# Patient Record
Sex: Male | Born: 1940 | Race: White | Hispanic: No | Marital: Single | Smoking: Never smoker
Health system: Southern US, Community
[De-identification: ages and names within clinical notes are randomized; demographics above are authoritative.]

## PROBLEM LIST (undated history)

## (undated) DIAGNOSIS — I10 Essential (primary) hypertension: Secondary | ICD-10-CM

## (undated) HISTORY — DX: Essential (primary) hypertension: I10

---

## 2000-06-18 ENCOUNTER — Encounter: Payer: Self-pay | Admitting: Urology

## 2000-06-18 ENCOUNTER — Ambulatory Visit (HOSPITAL_COMMUNITY): Admission: RE | Admit: 2000-06-18 | Discharge: 2000-06-18 | Payer: Self-pay | Admitting: Urology

## 2000-06-25 ENCOUNTER — Ambulatory Visit (HOSPITAL_COMMUNITY): Admission: RE | Admit: 2000-06-25 | Discharge: 2000-06-25 | Payer: Self-pay | Admitting: Urology

## 2002-03-09 ENCOUNTER — Emergency Department (HOSPITAL_COMMUNITY): Admission: EM | Admit: 2002-03-09 | Discharge: 2002-03-09 | Payer: Self-pay | Admitting: Emergency Medicine

## 2002-03-09 ENCOUNTER — Encounter: Payer: Self-pay | Admitting: Emergency Medicine

## 2002-03-30 ENCOUNTER — Inpatient Hospital Stay (HOSPITAL_COMMUNITY): Admission: EM | Admit: 2002-03-30 | Discharge: 2002-04-09 | Payer: Self-pay | Admitting: Emergency Medicine

## 2002-03-30 ENCOUNTER — Encounter: Payer: Self-pay | Admitting: Specialist

## 2002-03-31 ENCOUNTER — Encounter: Payer: Self-pay | Admitting: Neurology

## 2002-04-03 ENCOUNTER — Encounter: Payer: Self-pay | Admitting: Specialist

## 2002-04-04 ENCOUNTER — Encounter: Payer: Self-pay | Admitting: Neurosurgery

## 2002-06-23 ENCOUNTER — Ambulatory Visit (HOSPITAL_COMMUNITY): Admission: RE | Admit: 2002-06-23 | Discharge: 2002-06-23 | Payer: Self-pay | Admitting: Neurology

## 2002-06-29 ENCOUNTER — Encounter: Payer: Self-pay | Admitting: Neurology

## 2002-06-29 ENCOUNTER — Inpatient Hospital Stay (HOSPITAL_COMMUNITY): Admission: RE | Admit: 2002-06-29 | Discharge: 2002-06-30 | Payer: Self-pay | Admitting: Interventional Radiology

## 2002-07-29 ENCOUNTER — Encounter: Payer: Self-pay | Admitting: Neurology

## 2002-07-29 ENCOUNTER — Ambulatory Visit (HOSPITAL_COMMUNITY): Admission: RE | Admit: 2002-07-29 | Discharge: 2002-07-29 | Payer: Self-pay | Admitting: Neurology

## 2002-11-06 ENCOUNTER — Ambulatory Visit (HOSPITAL_COMMUNITY): Admission: RE | Admit: 2002-11-06 | Discharge: 2002-11-06 | Payer: Self-pay | Admitting: Neurology

## 2002-11-06 ENCOUNTER — Encounter: Payer: Self-pay | Admitting: Neurology

## 2003-02-17 ENCOUNTER — Encounter
Admission: RE | Admit: 2003-02-17 | Discharge: 2003-05-18 | Payer: Self-pay | Admitting: Physical Medicine & Rehabilitation

## 2003-04-18 ENCOUNTER — Encounter
Admission: RE | Admit: 2003-04-18 | Discharge: 2003-06-12 | Payer: Self-pay | Admitting: Physical Medicine & Rehabilitation

## 2003-05-19 ENCOUNTER — Encounter
Admission: RE | Admit: 2003-05-19 | Discharge: 2003-08-17 | Payer: Self-pay | Admitting: Physical Medicine & Rehabilitation

## 2003-09-20 ENCOUNTER — Encounter
Admission: RE | Admit: 2003-09-20 | Discharge: 2003-12-19 | Payer: Self-pay | Admitting: Physical Medicine & Rehabilitation

## 2003-12-21 ENCOUNTER — Encounter
Admission: RE | Admit: 2003-12-21 | Discharge: 2004-03-20 | Payer: Self-pay | Admitting: Physical Medicine & Rehabilitation

## 2003-12-28 ENCOUNTER — Ambulatory Visit (HOSPITAL_COMMUNITY)
Admission: RE | Admit: 2003-12-28 | Discharge: 2003-12-28 | Payer: Self-pay | Admitting: Physical Medicine & Rehabilitation

## 2004-05-02 ENCOUNTER — Encounter
Admission: RE | Admit: 2004-05-02 | Discharge: 2004-07-31 | Payer: Self-pay | Admitting: Physical Medicine & Rehabilitation

## 2004-05-03 ENCOUNTER — Ambulatory Visit: Payer: Self-pay | Admitting: Physical Medicine & Rehabilitation

## 2004-05-04 ENCOUNTER — Emergency Department (HOSPITAL_COMMUNITY): Admission: EM | Admit: 2004-05-04 | Discharge: 2004-05-04 | Payer: Self-pay | Admitting: Emergency Medicine

## 2004-05-09 ENCOUNTER — Encounter
Admission: RE | Admit: 2004-05-09 | Discharge: 2004-05-09 | Payer: Self-pay | Admitting: Physical Medicine & Rehabilitation

## 2004-09-09 ENCOUNTER — Ambulatory Visit: Payer: Self-pay | Admitting: Physical Medicine & Rehabilitation

## 2004-09-09 ENCOUNTER — Encounter
Admission: RE | Admit: 2004-09-09 | Discharge: 2004-12-04 | Payer: Self-pay | Admitting: Physical Medicine & Rehabilitation

## 2004-12-04 ENCOUNTER — Encounter
Admission: RE | Admit: 2004-12-04 | Discharge: 2005-03-04 | Payer: Self-pay | Admitting: Physical Medicine & Rehabilitation

## 2004-12-06 ENCOUNTER — Ambulatory Visit: Payer: Self-pay | Admitting: Physical Medicine & Rehabilitation

## 2005-03-07 ENCOUNTER — Ambulatory Visit: Admission: RE | Admit: 2005-03-07 | Discharge: 2005-03-07 | Payer: Self-pay | Admitting: Urology

## 2005-04-10 ENCOUNTER — Encounter (INDEPENDENT_AMBULATORY_CARE_PROVIDER_SITE_OTHER): Payer: Self-pay | Admitting: Specialist

## 2005-04-10 ENCOUNTER — Inpatient Hospital Stay (HOSPITAL_COMMUNITY): Admission: RE | Admit: 2005-04-10 | Discharge: 2005-04-12 | Payer: Self-pay | Admitting: Urology

## 2005-05-20 ENCOUNTER — Encounter
Admission: RE | Admit: 2005-05-20 | Discharge: 2005-08-18 | Payer: Self-pay | Admitting: Physical Medicine & Rehabilitation

## 2005-05-20 ENCOUNTER — Ambulatory Visit: Payer: Self-pay | Admitting: Physical Medicine & Rehabilitation

## 2005-08-06 ENCOUNTER — Ambulatory Visit: Payer: Self-pay | Admitting: Physical Medicine & Rehabilitation

## 2006-01-23 ENCOUNTER — Ambulatory Visit: Payer: Self-pay | Admitting: Physical Medicine & Rehabilitation

## 2006-01-23 ENCOUNTER — Encounter
Admission: RE | Admit: 2006-01-23 | Discharge: 2006-04-23 | Payer: Self-pay | Admitting: Physical Medicine & Rehabilitation

## 2006-07-24 ENCOUNTER — Encounter
Admission: RE | Admit: 2006-07-24 | Discharge: 2006-10-22 | Payer: Self-pay | Admitting: Physical Medicine & Rehabilitation

## 2006-07-24 ENCOUNTER — Ambulatory Visit: Payer: Self-pay | Admitting: Physical Medicine & Rehabilitation

## 2007-02-09 ENCOUNTER — Encounter
Admission: RE | Admit: 2007-02-09 | Discharge: 2007-05-10 | Payer: Self-pay | Admitting: Physical Medicine & Rehabilitation

## 2007-02-09 ENCOUNTER — Ambulatory Visit: Payer: Self-pay | Admitting: Physical Medicine & Rehabilitation

## 2007-08-09 ENCOUNTER — Encounter
Admission: RE | Admit: 2007-08-09 | Discharge: 2007-10-21 | Payer: Self-pay | Admitting: Physical Medicine & Rehabilitation

## 2007-08-09 ENCOUNTER — Ambulatory Visit: Payer: Self-pay | Admitting: Physical Medicine & Rehabilitation

## 2008-01-31 ENCOUNTER — Encounter
Admission: RE | Admit: 2008-01-31 | Discharge: 2008-04-30 | Payer: Self-pay | Admitting: Physical Medicine & Rehabilitation

## 2008-02-02 ENCOUNTER — Ambulatory Visit: Payer: Self-pay | Admitting: Physical Medicine & Rehabilitation

## 2008-02-08 ENCOUNTER — Ambulatory Visit: Payer: Self-pay | Admitting: Physical Medicine & Rehabilitation

## 2008-03-29 ENCOUNTER — Ambulatory Visit: Payer: Self-pay | Admitting: Physical Medicine & Rehabilitation

## 2008-06-20 ENCOUNTER — Encounter
Admission: RE | Admit: 2008-06-20 | Discharge: 2008-06-21 | Payer: Self-pay | Admitting: Physical Medicine & Rehabilitation

## 2008-06-21 ENCOUNTER — Ambulatory Visit: Payer: Self-pay | Admitting: Physical Medicine & Rehabilitation

## 2008-09-12 ENCOUNTER — Encounter
Admission: RE | Admit: 2008-09-12 | Discharge: 2008-12-11 | Payer: Self-pay | Admitting: Physical Medicine & Rehabilitation

## 2008-09-13 ENCOUNTER — Ambulatory Visit: Payer: Self-pay | Admitting: Physical Medicine & Rehabilitation

## 2008-11-08 ENCOUNTER — Ambulatory Visit: Payer: Self-pay | Admitting: Physical Medicine & Rehabilitation

## 2009-06-07 ENCOUNTER — Inpatient Hospital Stay (HOSPITAL_COMMUNITY): Admission: EM | Admit: 2009-06-07 | Discharge: 2009-06-11 | Payer: Self-pay | Admitting: Emergency Medicine

## 2009-06-07 ENCOUNTER — Ambulatory Visit: Payer: Self-pay | Admitting: Cardiovascular Disease

## 2009-06-08 ENCOUNTER — Encounter (INDEPENDENT_AMBULATORY_CARE_PROVIDER_SITE_OTHER): Payer: Self-pay | Admitting: Internal Medicine

## 2009-06-08 ENCOUNTER — Ambulatory Visit: Payer: Self-pay | Admitting: Surgery

## 2010-10-25 IMAGING — CR DG CHEST 2V
2 series · 2 of 2 positions shown · non-contrast
Comparison: 03/07/2005

CLINICAL DATA: Altered level of consciousness.  Confusion and
nausea and vomiting since a fall today.  Head trauma.

CHEST - 2 VIEW

[w chest pa]
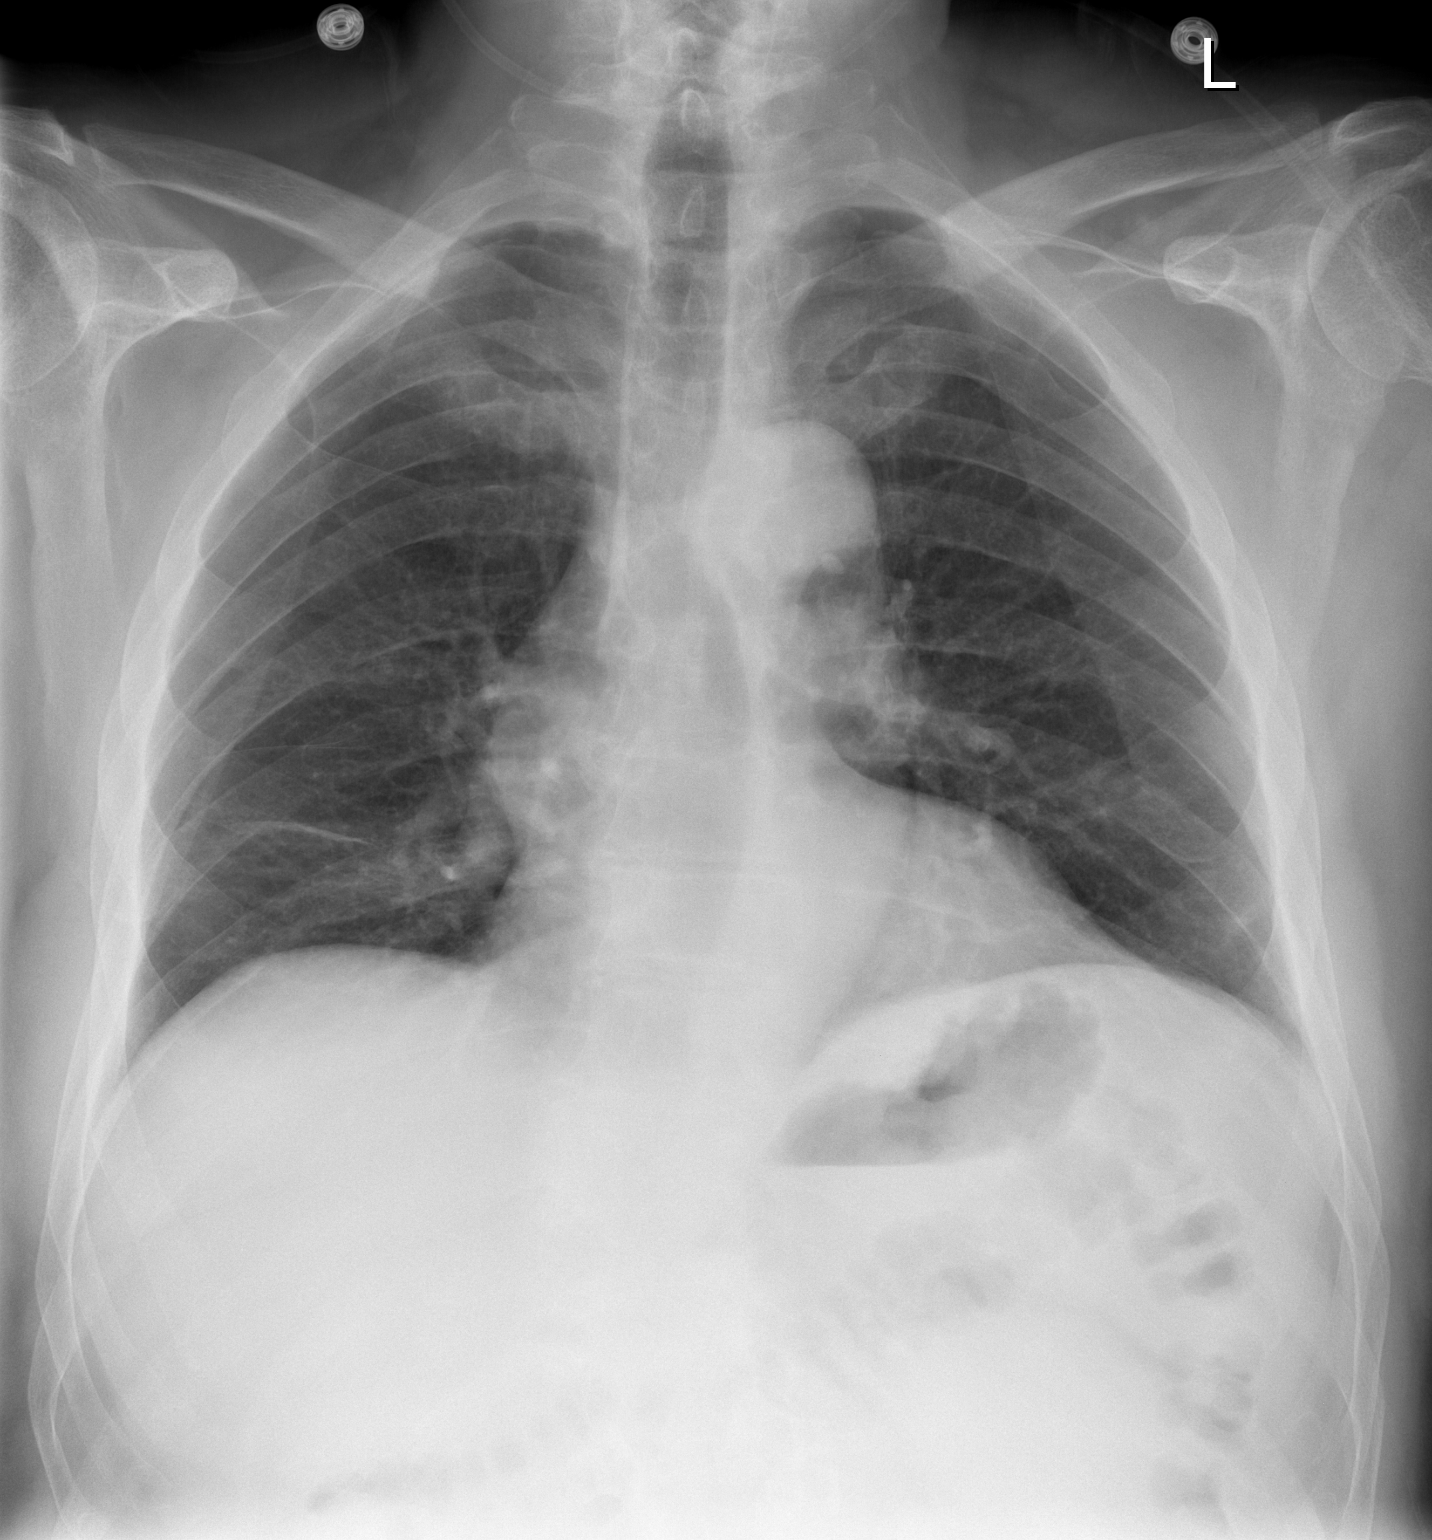

[w chest lat]
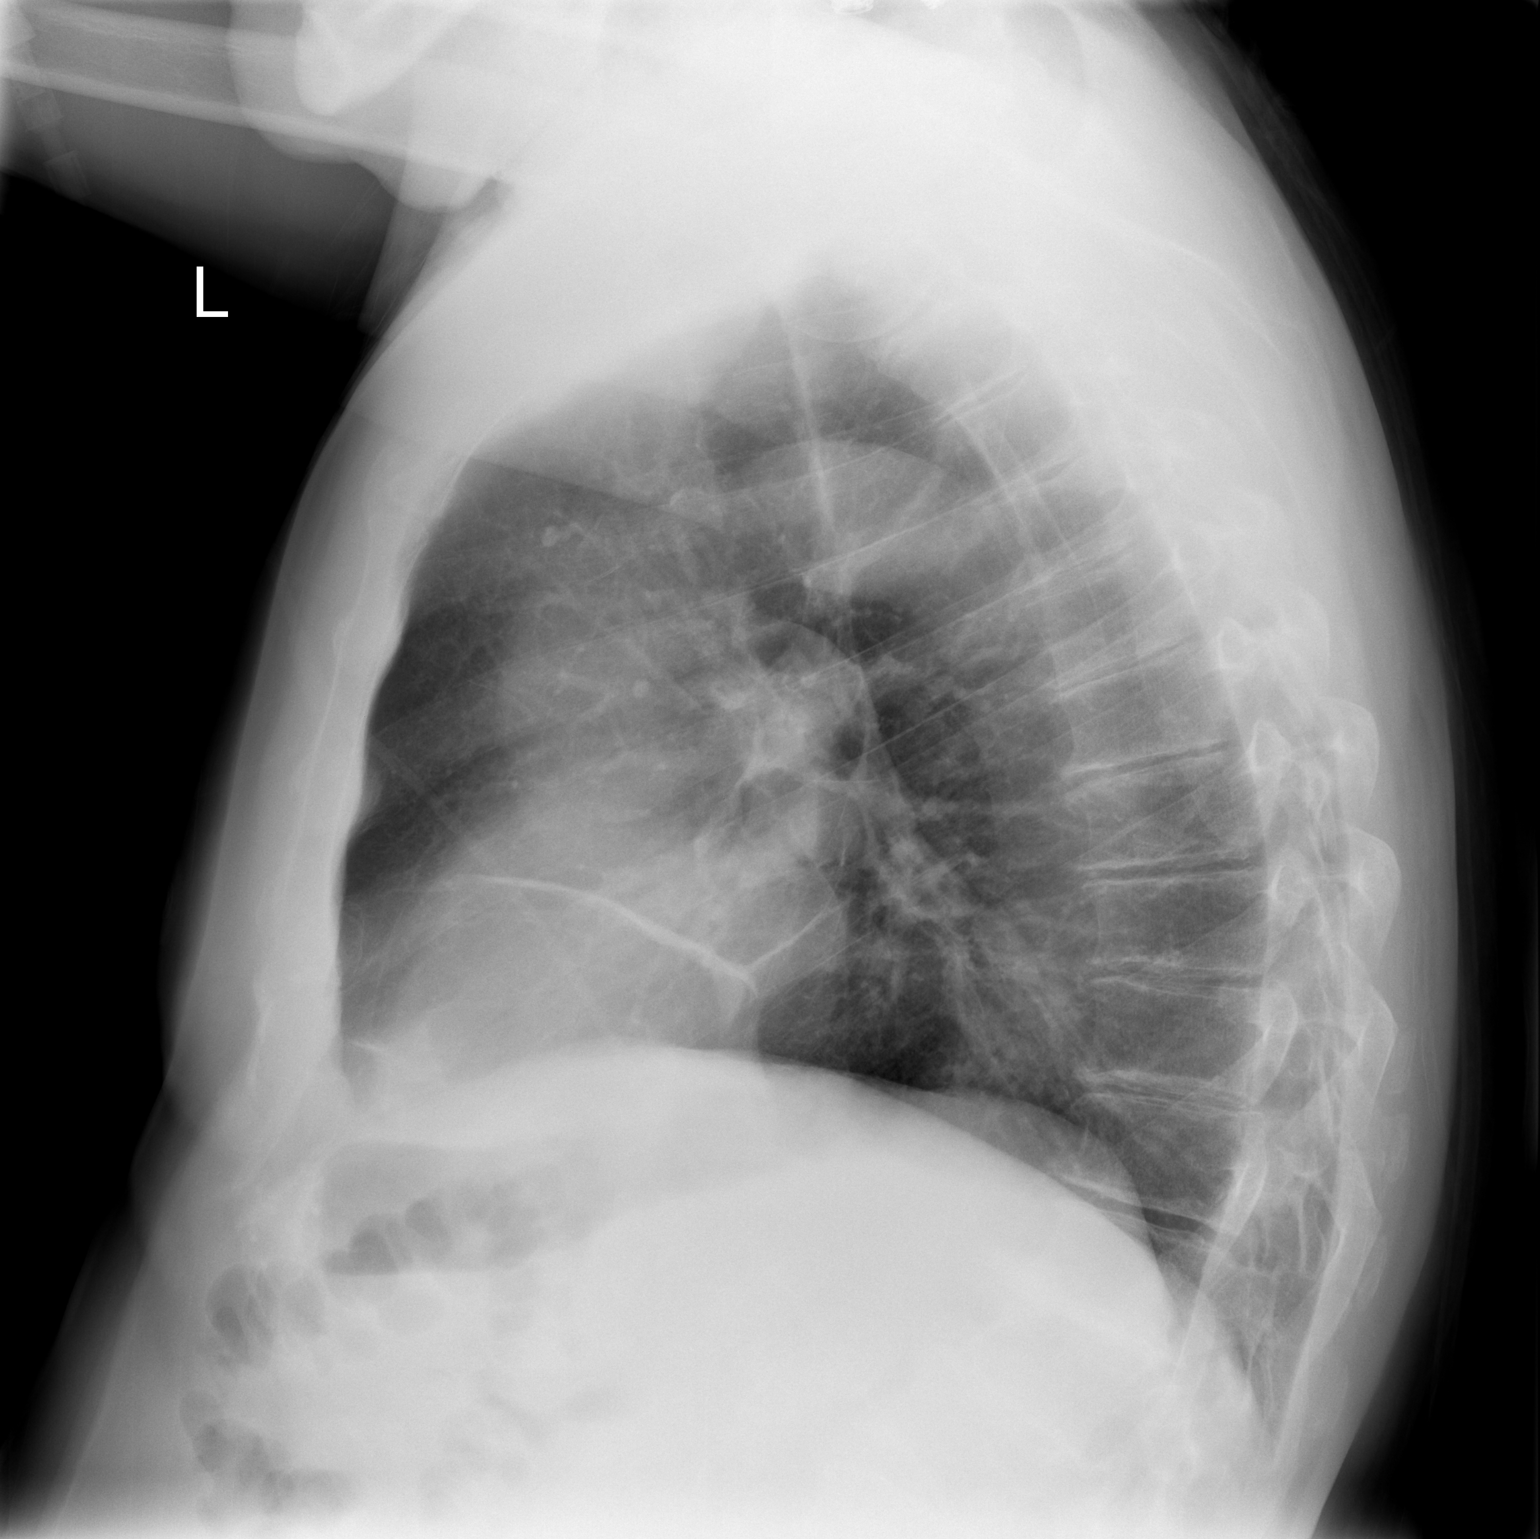

[2 of 2 positions shown; findings below may reference images not displayed]

FINDINGS: The heart size and vascularity are normal except for
tortuosity of the thoracic aorta.  There is some minimal
atelectasis in the right base anteriorly.  The lungs are otherwise
clear.  No significant bony abnormality.
IMPRESSION: Minimal atelectasis at the right lung base.

## 2010-10-25 IMAGING — CT CT HEAD W/O CM
1 of 2 series · 16 of 30 positions shown, 20 images · non-contrast
Comparison: 05/04/2004

CLINICAL DATA: The patient fell and struck the back of the head and
has altered level of consciousness.  Severe nausea.

CT HEAD WITHOUT CONTRAST
TECHNIQUE: Contiguous axial images were obtained from the base of
the skull through the vertex without contrast.

[Series 3: recon 2: brain · axial · 0.47mm/px · z∈[+155,+300]mm · 16 of 64 slices shown, 20 images]
[im 4/64  brain]
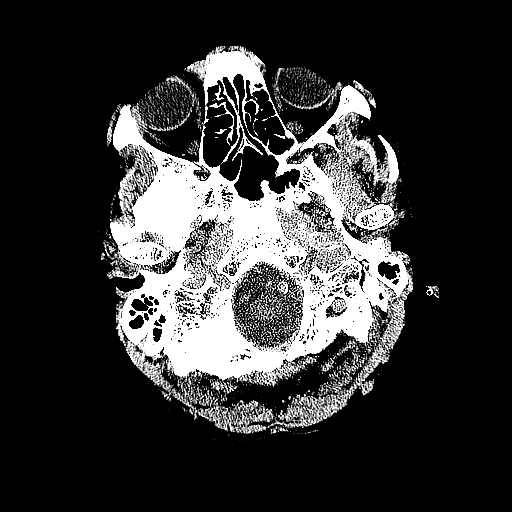
[im 4/64  bone]
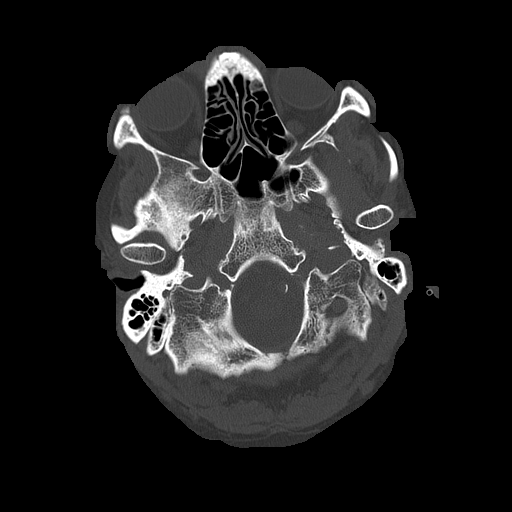
[im 7/64  brain]
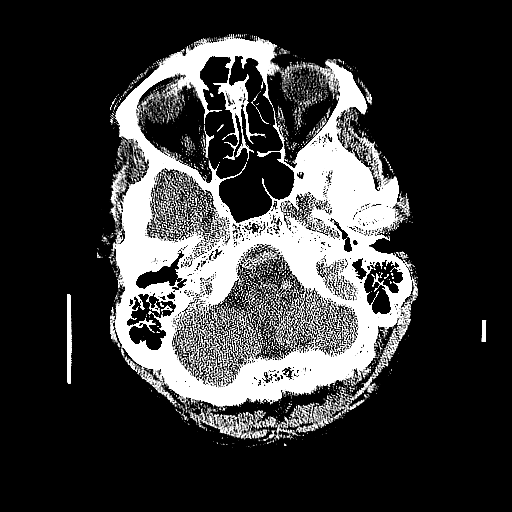
[im 10/64  brain]
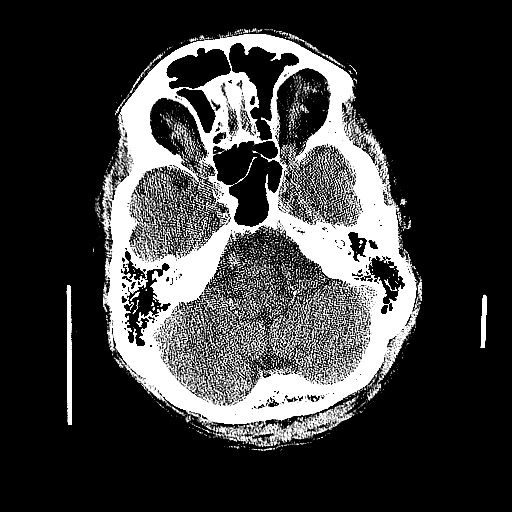
[im 14/64  brain]
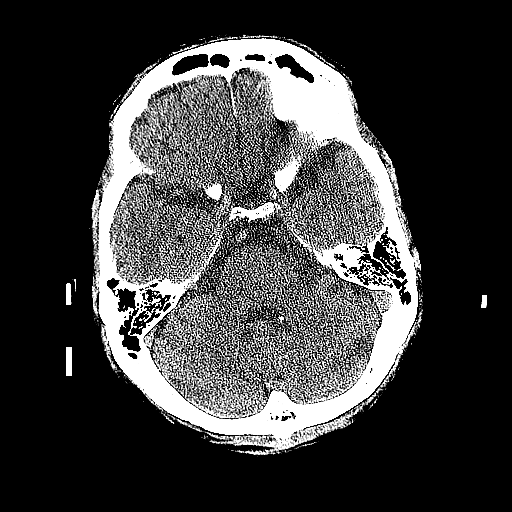
[im 20/64  brain]
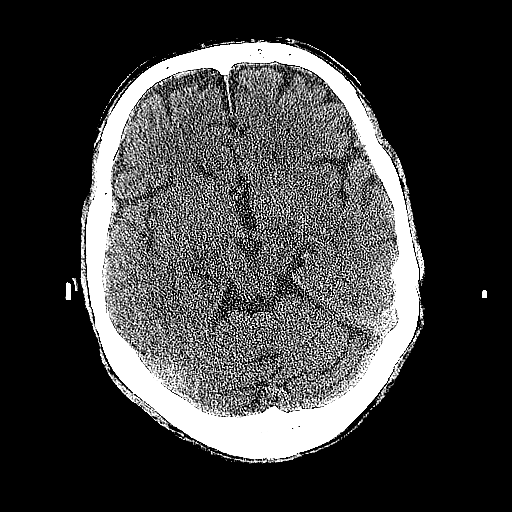
[im 20/64  bone]
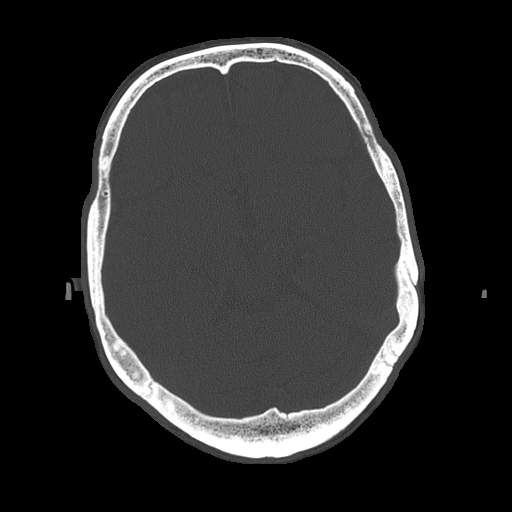
[im 24/64  brain]
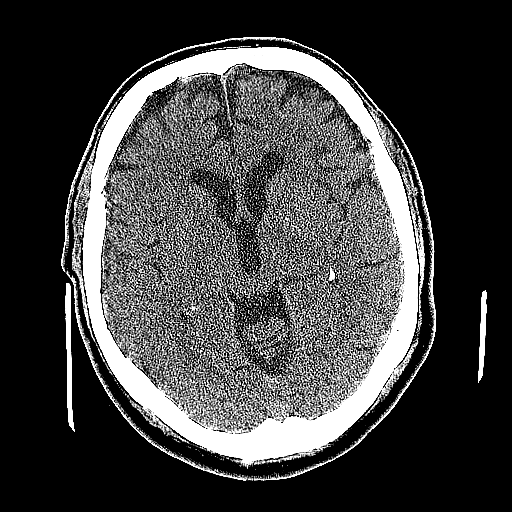
[im 27/64  brain]
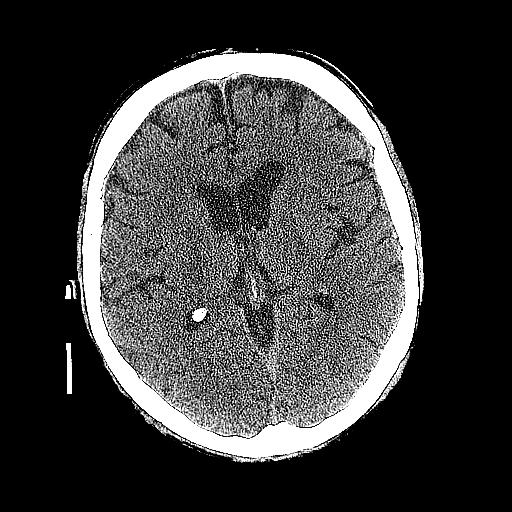
[im 30/64  brain]
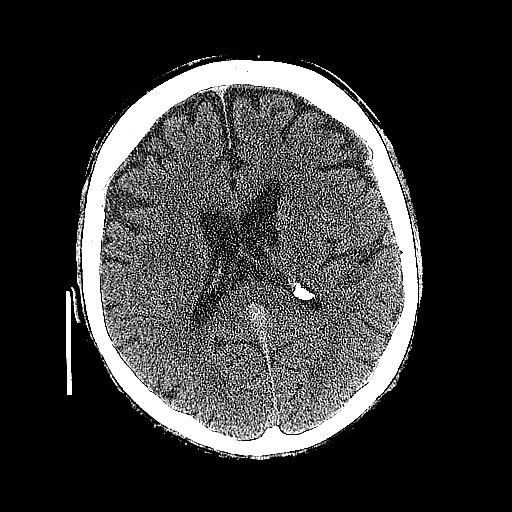
[im 34/64  brain]
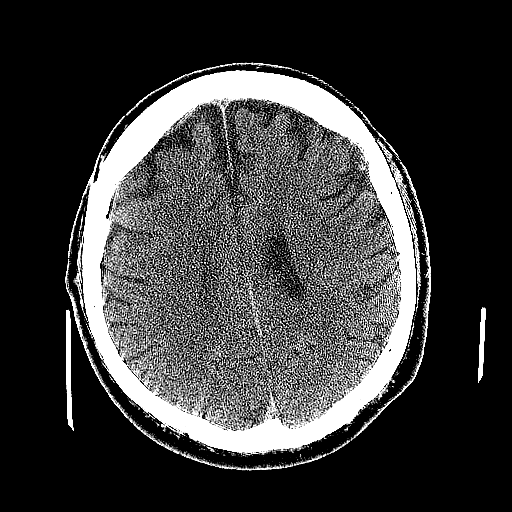
[im 34/64  bone]
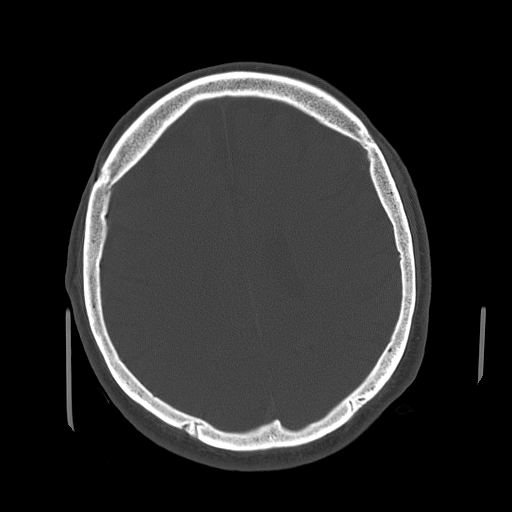
[im 37/64  brain]
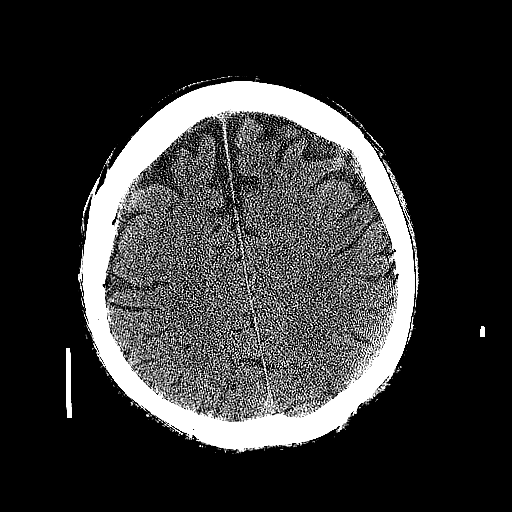
[im 40/64  brain]
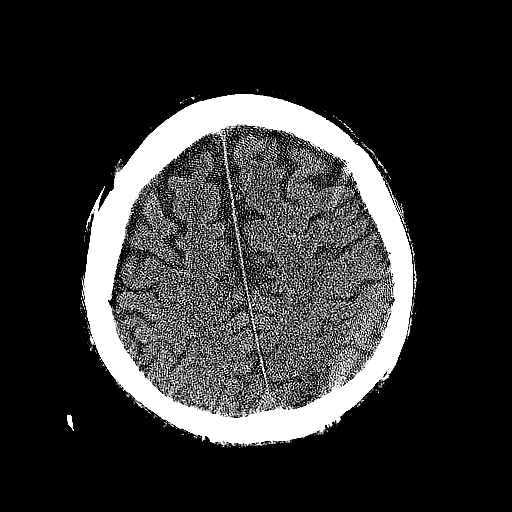
[im 44/64  brain]
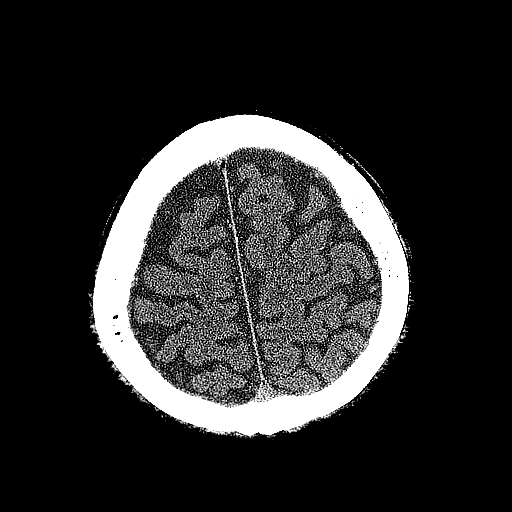
[im 50/64  brain]
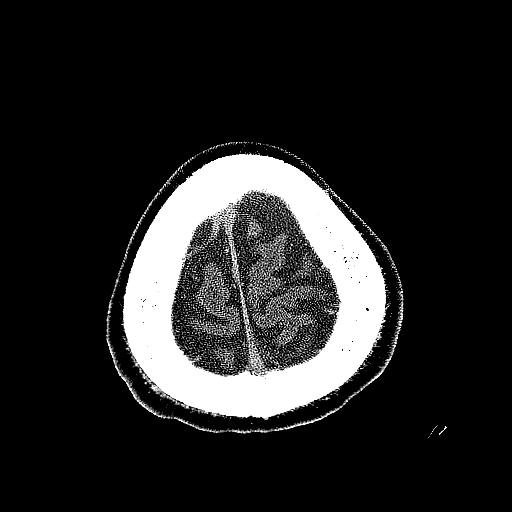
[im 50/64  bone]
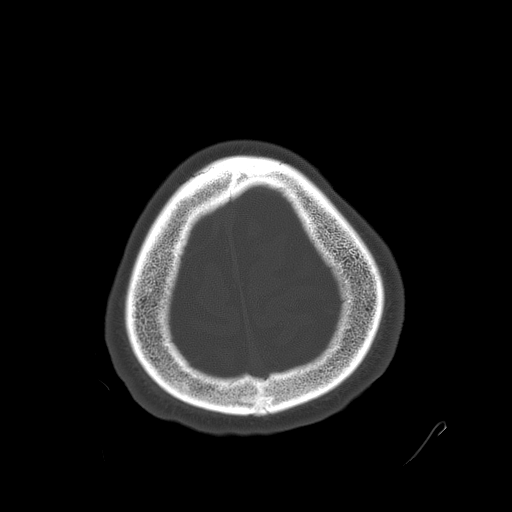
[im 54/64  brain]
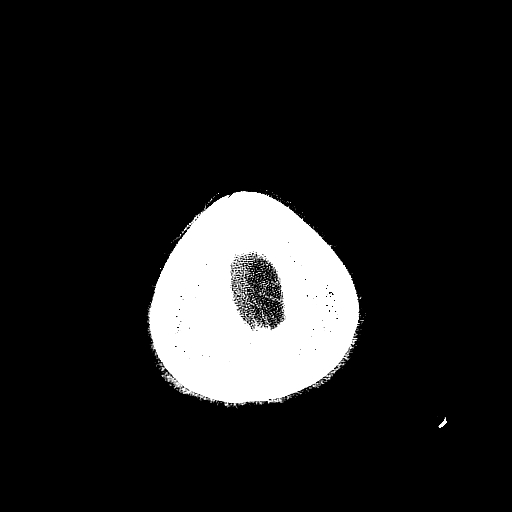
[im 57/64  brain]
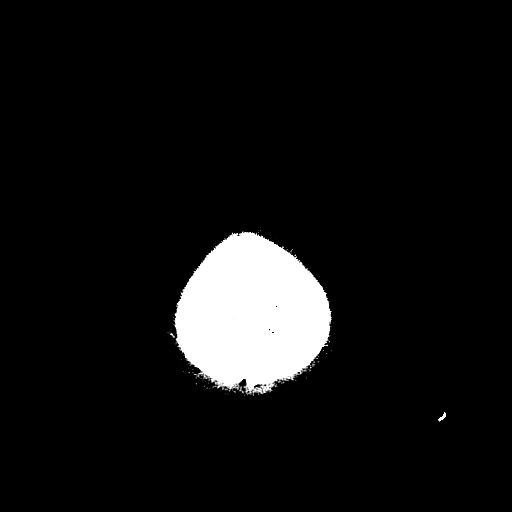
[im 60/64  brain]
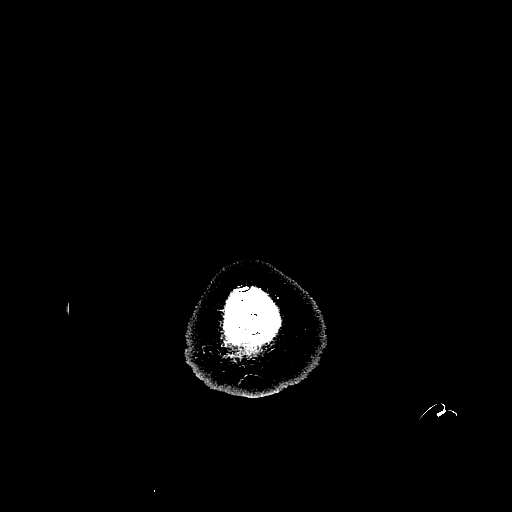

[16 of 30 positions shown; findings below may reference images not displayed]

FINDINGS: There is no acute infarction, intracranial hemorrhage, or
intracranial mass lesion.  The brain parenchyma is normal except
for some mild  atrophy.  The bony structures are normal.
IMPRESSION: No significant abnormalities.

## 2010-11-13 LAB — COMPREHENSIVE METABOLIC PANEL
ALT: 71 U/L — ABNORMAL HIGH (ref 0–53)
Albumin: 3.5 g/dL (ref 3.5–5.2)
BUN: 26 mg/dL — ABNORMAL HIGH (ref 6–23)
GFR calc non Af Amer: >60 mL/min (ref >60)
Glucose, Bld: 97 mg/dL (ref 70–99)
Sodium: 142 mEq/L (ref 135–145)
Total Bilirubin: 1.3 mg/dL — ABNORMAL HIGH (ref 0.3–1.2)

## 2010-11-14 LAB — LIPID PANEL
Cholesterol: 168 mg/dL (ref 0–200)
Total CHOL/HDL Ratio: 4.2 RATIO
VLDL: 22 mg/dL (ref 0–40)

## 2010-11-14 LAB — CBC
HCT: 45.3 % (ref 39.0–52.0)
HCT: 45.5 % (ref 39.0–52.0)
Hemoglobin: 15.8 g/dL (ref 13.0–17.0)
Hemoglobin: 15.9 g/dL (ref 13.0–17.0)
Hemoglobin: 17.4 g/dL — ABNORMAL HIGH (ref 13.0–17.0)
Hemoglobin: 17.7 g/dL — ABNORMAL HIGH (ref 13.0–17.0)
MCHC: 34.7 g/dL (ref 30.0–36.0)
MCV: 92.8 fL (ref 78.0–100.0)
MCV: 93.2 fL (ref 78.0–100.0)
MCV: 93.2 fL (ref 78.0–100.0)
MCV: 93.4 fL (ref 78.0–100.0)
Platelets: 197 10*3/uL (ref 150–400)
RBC: 5.31 MIL/uL (ref 4.22–5.81)
RBC: 5.49 MIL/uL (ref 4.22–5.81)
RDW: 14.2 % (ref 11.5–15.5)
RDW: 14.3 % (ref 11.5–15.5)
WBC: 6.1 10*3/uL (ref 4.0–10.5)

## 2010-11-14 LAB — COMPREHENSIVE METABOLIC PANEL
ALT: 116 U/L — ABNORMAL HIGH (ref 0–53)
ALT: 135 U/L — ABNORMAL HIGH (ref 0–53)
AST: 53 U/L — ABNORMAL HIGH (ref 0–37)
AST: 70 U/L — ABNORMAL HIGH (ref 0–37)
Alkaline Phosphatase: 47 U/L (ref 39–117)
Alkaline Phosphatase: 48 U/L (ref 39–117)
Alkaline Phosphatase: 58 U/L (ref 39–117)
BUN: 23 mg/dL (ref 6–23)
BUN: 25 mg/dL — ABNORMAL HIGH (ref 6–23)
BUN: 39 mg/dL — ABNORMAL HIGH (ref 6–23)
BUN: 46 mg/dL — ABNORMAL HIGH (ref 6–23)
CO2: 22 mEq/L (ref 19–32)
CO2: 24 mEq/L (ref 19–32)
CO2: 26 mEq/L (ref 19–32)
Calcium: 8.7 mg/dL (ref 8.4–10.5)
Calcium: 9.2 mg/dL (ref 8.4–10.5)
Calcium: 9.6 mg/dL (ref 8.4–10.5)
Chloride: 106 mEq/L (ref 96–112)
Chloride: 107 mEq/L (ref 96–112)
Chloride: 112 mEq/L (ref 96–112)
Chloride: 98 mEq/L (ref 96–112)
Creatinine, Ser: 0.89 mg/dL (ref 0.4–1.5)
GFR calc Af Amer: >60 mL/min (ref >60)
GFR calc Af Amer: >60 mL/min (ref >60)
GFR calc non Af Amer: >60 mL/min (ref >60)
GFR calc non Af Amer: >60 mL/min (ref >60)
GFR calc non Af Amer: >60 mL/min (ref >60)
GFR calc non Af Amer: >60 mL/min (ref >60)
Glucose, Bld: 100 mg/dL — ABNORMAL HIGH (ref 70–99)
Glucose, Bld: 135 mg/dL — ABNORMAL HIGH (ref 70–99)
Glucose, Bld: 145 mg/dL — ABNORMAL HIGH (ref 70–99)
Glucose, Bld: 98 mg/dL (ref 70–99)
Glucose, Bld: 98 mg/dL (ref 70–99)
Potassium: 3.3 mEq/L — ABNORMAL LOW (ref 3.5–5.1)
Potassium: 3.4 mEq/L — ABNORMAL LOW (ref 3.5–5.1)
Potassium: 3.5 mEq/L (ref 3.5–5.1)
Potassium: 3.5 mEq/L (ref 3.5–5.1)
Sodium: 142 mEq/L (ref 135–145)
Sodium: 145 mEq/L (ref 135–145)
Total Bilirubin: 1.6 mg/dL — ABNORMAL HIGH (ref 0.3–1.2)
Total Bilirubin: 1.8 mg/dL — ABNORMAL HIGH (ref 0.3–1.2)
Total Bilirubin: 2.3 mg/dL — ABNORMAL HIGH (ref 0.3–1.2)
Total Bilirubin: 2.6 mg/dL — ABNORMAL HIGH (ref 0.3–1.2)
Total Protein: 5.8 g/dL — ABNORMAL LOW (ref 6.0–8.3)
Total Protein: 6 g/dL (ref 6.0–8.3)
Total Protein: 7 g/dL (ref 6.0–8.3)

## 2010-11-14 LAB — HEPATITIS PANEL, ACUTE: Hep A IgM: NEGATIVE

## 2010-11-14 LAB — URINE MICROSCOPIC-ADD ON

## 2010-11-14 LAB — DIFFERENTIAL
Basophils Absolute: 0 10*3/uL (ref 0.0–0.1)
Basophils Relative: 0 % (ref 0–1)
Lymphs Abs: 1.3 10*3/uL (ref 0.7–4.0)
Monocytes Relative: 10 % (ref 3–12)
Neutro Abs: 7.9 10*3/uL — ABNORMAL HIGH (ref 1.7–7.7)

## 2010-11-14 LAB — CARDIAC PANEL(CRET KIN+CKTOT+MB+TROPI)
CK, MB: 2 ng/mL (ref 0.3–4.0)
Relative Index: INVALID (ref 0.0–2.5)
Total CK: 76 U/L (ref 7–232)

## 2010-11-14 LAB — URINE CULTURE: Colony Count: >=100000

## 2010-11-14 LAB — POCT CARDIAC MARKERS
CKMB, poc: 1.5 ng/mL (ref 1.0–8.0)
Myoglobin, poc: 297 ng/mL (ref 12–200)

## 2010-11-14 LAB — URINALYSIS, ROUTINE W REFLEX MICROSCOPIC: Glucose, UA: NEGATIVE mg/dL

## 2010-11-14 LAB — TROPONIN I: Troponin I: 0.02 ng/mL (ref 0.00–0.06)

## 2010-11-14 LAB — LIPASE, BLOOD: Lipase: 25 U/L (ref 11–59)

## 2010-12-24 NOTE — Assessment & Plan Note (Signed)
HISTORY OF PRESENT ILLNESS:  Montrail is back due to intolerance of his  Darvocet, we prescribed last week.  He stated that he had chills and  tingling and vomiting with the medication, so he stopped it.  He had  concerns over the Tylenol.  I described to him that he had taken  Percocet before without any problems.  So concern and fell back on  propoxyphene.  Otherwise, no changes noted today.   Exam as stated last week.   ASSESSMENT:  1. 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  2. Dysesthetic spinal cord pain.  3. Low back pain due to facet arthropathy and degenerative joint      disease.   PLAN:  1. We will change the Darvocet to oxycodone 5 mg q.6 h. p.r.n.  The      expectation is to be used 3 or less a day.  If he requires more      regular medication, we will need to look at a long acting agent.  2. Continue Neurontin for now.  3. I will see him back in about 2 months.      Ranelle Oyster, M.D.  Electronically Signed     ZTS/MedQ  D:  02/08/2008 13:17:02  T:  02/09/2008 07:13:17  Job #:  [id number]   cc:   Gabriel Earing, M.D.  Fax: [phone]

## 2010-12-24 NOTE — Assessment & Plan Note (Signed)
HISTORY:  Scottie is back regarding his low back and leg pain.  The pain  has been under fair control.  He still has some days where it bothers  him.  He continues to exercise vigorously.  He uses Vicodin and Naproxen  for break-through pain.  He occasionally uses Celebrex as well.  Neurontin helps his leg symptoms although it does aid in his rectal  dysfunction as well.  The patient's mood has been fair.  No other  changes noted today.   REVIEW OF SYSTEMS:  Notable for some weight gain, occasional limb  swelling, easy bleeding, sleep apnea symptoms.   SOCIAL HISTORY:  Without significant change.  He lives with his  significant other.   PHYSICAL EXAMINATION:  VITAL SIGNS:  Blood pressure 154/79, pulse 83,  respiratory rate 18, satting 97% on room air.  GENERAL:  The patient is pleasant alert and oriented x3.  Affect is  bright and appropriate.  Gait is stable.  Strength is 5/5 with  hyperactive reflexes, still at 3+.  Cognitively, he is appropriate.  He  has 1+/2 sensation in both legs.   ASSESSMENT:  1. History of neuraxial epidural and subdural hematomas with      subsequent incomplete C6 spinal cord injury.  2. Dysesthetic spinal cord pain.  3. Lower back pain due to facet arthropathy and degenerative joint      disease at L4-L5 and L5-S1.  4. Spastic paraparesis.  5. Anxiety/depression.  6. Erectile dysfunction.   PLAN:  1. I gave the patient a prescription for Viagra which he will try to      have filled through the drug-assistance program at [facility].  He was      given 50 mg one daily p.r.n.  2. Continue Neurontin, Norco, Celebrex and Naproxen for pain.  He is      using the Celebrex and Naproxen on an as needed basis only.  3. I gave the patient Pilates exercises to work on to improve his      core.  4. I will see him back in about 6 months.      Ranelle Oyster, M.D.  Electronically Signed     ZTS/MedQ  D:  08/10/2007 09:50:39  T:  08/10/2007 10:08:56  Job #:   [id number]   cc:   Gabriel Earing, M.D.  Fax: [phone]

## 2010-12-24 NOTE — Assessment & Plan Note (Signed)
William Bryan is back regarding his multiple pain issues.  He has been doing  fairly well over the last few months.  He feels the most of his nausea  was due to his viral illness and has since resolved.  He rates his pain  as 7-8/10, described as sharp, constant, and aching.  His Oswestry score  is 46% today.  He has been trying to stay active.  He likes his TENS  unit to quit at nighttime.  He uses on average 2 oxycodone a day.  Pain  is in the back and both legs.  He remains on Neurontin for neuropathic  pain control at 600 mg q.i.d.   REVIEW OF SYSTEMS:  Notable for the above.  Full 14-point review is in  the written health and history section of the chart.   SOCIAL HISTORY:  The patient lives alone.  He is divorced.  He is not  smoking.   PHYSICAL EXAMINATION:  VITAL SIGNS:  Blood pressure is 147/85, pulse is  88, respiratory rate 18, and sating 99% on room air.  GENERAL:  The patient is pleasant, alert, and oriented x3.  Strength  remains 5/5 with 3+ reflexes in the legs.  Gait stable.  Able to bend at  the waist without significant pain today.  Sensory exam is unchanged.  Cognitively, he is intact.  HEART:  Regular.  CHEST:  Clear.  ABDOMEN:  Soft and nontender.   ASSESSMENT:  1. History of neuraxial epidural/subdural hematoma with subsequent C6      spinal cord injury.  2. Dysesthetic spinal cord pain.  3. Lumbar facet arthropathy.  4. Anxiety and depression/bipolar disorder.  5. Erectile dysfunction.   PLAN:  1. Refill oxycodone 5 mg #60 one q.8-12 h. p.r.n.  I gave him a      prescription for next month.  I explained to him that we will need      to see him at least every 2 months since he is on the oxycodone.      The patient agrees.  2. Continue Neurontin and Viagra at current doses.  3. It is interesting in the note that his primary team placed him on      Seroquel at nighttime for manic symptoms.  This seems to have      helped sleep, etc.  Continue with this medication  per his primary      team.  4. I will see him back in 4 months with a 64-month nursing clinic      followup.      Ranelle Oyster, M.D.  Electronically Signed     ZTS/MedQ  D:  09/13/2008 10:06:45  T:  09/13/2008 23:16:06  Job #:  [id number]   cc:   Gabriel Earing, M.D.  Fax: [phone]

## 2010-12-24 NOTE — Assessment & Plan Note (Signed)
FOLLOWUP OFFICE NOTE   William Bryan is back regarding his low back and leg pain.  The pain comes and  goes.  He states that it might be a bit worse than it has been.  It  seems to be worse when he is doing his abdominal exercises.  He uses  Vicodin and occasional naproxen for pain.  Neurontin seems to help his  leg symptoms.  The patient does try to stay active with his regular  workouts.  No other significant changes are noted today.   REVIEW OF SYSTEMS:  Notable for numbness, depression, occasional  anxiety, weight gain, limb swelling.  Full review is in the health and  history section.   SOCIAL HISTORY:  The patient is single and notes no new changes today.   PHYSICAL EXAM:  Blood pressure is 152/77, pulse 80, respiratory rate 17.  He is satting 96% on room air.  The patient is pleasant, alert and oriented x3.  Affect is bright and  appropriate.  Gait is stable. Motor function generally is 5/5 in both lower  extremities with hyperactive reflexes.  Cognitively he is appropriate.  His sensation is stable at 1+/2.   ASSESSMENT:  1. History of neuraxial epidural and subdural hematomas, subsequent      incomplete C6 spinal cord injury.  2. Dysesthetic spinal cord pain.  3. Lower back pain due to facet arthropathy and degenerative joint      disease at L4-5 and L5-S1.  4. Spastic paraparesis.  5. Anxiety and depression.   PLAN:  1. Continue exercising.  I asked him to be reasonable.  He needs to      stay away from the heavy weight lifting and abdominal crunches.  2. Will add Celebrex schedule 200 mg daily.  3. Continue Neurontin.  4. Will increase Vicodin to Norco 7.5/325 half to 1 q.12h p.r.n.  5. I will see the patient back in about 6 months' time.      Ranelle Oyster, M.D.  Electronically Signed     ZTS/MedQ  D:  02/16/2007 12:44:52  T:  02/16/2007 14:33:36  Job #:  [id number]   cc:   Gabriel Earing, M.D.  Fax: [phone]

## 2010-12-24 NOTE — Assessment & Plan Note (Signed)
William Bryan is back regarding his low back and leg pain.  He has had some  insistent issues with what he calls the H1N1 virus.  He has had some  nausea although that is improved recently.  He discussed his nausea last  visit, may or may not be from his oxycodone.  He is not sure.  He is on  Neurontin for his neuropathic pain.  Pain is generally around 6-7/10,  and he described it as stabbing, aching, and constant.  Pain interferes  with general activity, relations with others, and enjoyment of life on a  moderate level.  Pain increases with walking, bending, sitting,  inactivity, standing, and improves with his medication and TENS unit.  Does try to stay active, going to the gym.   REVIEW OF SYSTEMS:  Notable for some depression and anxiety.  Other  pertinent positives are above and full review is in the written health  and history section.   SOCIAL HISTORY:  The patient is divorced and living alone.   PHYSICAL EXAMINATION:  VITAL SIGNS:  Blood pressure is 141/82, pulse 74,  respiratory rate 18, and he is saturating 99% on room air.  GENERAL:  The patient is pleasant, alert, and oriented x3.  EXTREMITIES:  Strength is generally 5/5 with hyperactive reflexes still  in the legs.  Gait is normal.  He still has some pain with flexion more  than extension.  Sensory exam is grossly intact.  NEUROLOGIC:  Cognitively, he is within normal limits.  Cranial nerve  exam is normal.  HEART:  Regular.  CHEST:  Clear.  ABDOMEN:  Soft and nontender.  Weight appears stable.   ASSESSMENT:  1. History of neuraxial epidural and subdural hematoma as well as      subsequent incomplete C6 spinal cord injury.  2. Dysesthetic spinal cord pain, which is improved.  3. Lumbar facet arthropathy.  4. Anxiety/depression.  5. Erectile dysfunction.   PLAN:  1. Maintain Viagra and Neurontin as written.  2. Refilled oxycodone 5 mg #90.  3. I gave him samples of Nexium to try 40 mg nightly to if see if he      is  having some gastritis or dyspepsia causing his nausea.  If he      does find these helpful, he may want to look at something over the      counter such as Pepcid or Prilosec.  4. Continue exercise as tolerated.  5. I will see him back in 3 months.      William Bryan, M.D.  Electronically Signed     ZTS/MedQ  D:  06/21/2008 09:44:18  T:  06/21/2008 22:20:59  Job #:  [id number]   cc:   William Bryan, M.D.  Fax: [phone]

## 2010-12-24 NOTE — Assessment & Plan Note (Signed)
William Bryan is back regarding his low back and leg pain.  He came off the  Vicodin over 6 weeks ago due to the fact that it was causing him  tinnitus and rash.  He says he was not using enough of it either to  really help his pain substantially.  He usually took half to one of them  a day.  He continues with Neurontin as previously dosed 600 mg 4 times a  day.  He use naproxen 3 times a day.  He also takes fish oil and garlic  supplements.  He recently was placed back on Remeron by a family  physician.  He takes Restoril at night for sleep as well as clonidine  0.2 four times a day.  He noticed that the pain has been worse  particularly in his axial spine.  He has persistent symptoms in the legs  with some numbness noted, but this is not substantially worse.  He had  to back off his exercise routine a bit.  He rates his pain at 9-10/10,  describes as a burning constant aching.   Sleep is poor to fair.   REVIEW OF SYSTEMS:  Notable for depression, anxiety, bladder control  problems, and limb swelling.  Other pertinent positives are above and  full review is in the written health and history section of the chart.   SOCIAL HISTORY:  He lives alone now.  He had been with his significant  other prior.   PHYSICAL EXAMINATION:  VITAL SIGNS:  Blood pressure is 143/72, pulse 84,  and respiratory rate 18.  He is satting 98% on room air.  NEUROLOGIC:  The patient is pleasant, alert, and oriented x3.  He has  become more limited in this movement with only 50 degrees of forward  flexion noted at the lumbosacral spine.  He is able to extend it about  10 degrees.  Lower back is somewhat painful to palpation in diffuse area  from L3-S1.  Paraspinals are tender.  Reflexes remained brisk at 3+  throughout lower extremities.  Strength is preserved at 5/5.  Sensation  is 1+/2 distally in the legs.   ASSESSMENT:  1. History of neuraxial epidural and subdural hematomas with      subsequent incomplete C6  spinal cord injury.  2. Dysesthetic spinal cord pain.  3. Chronic low back pain related to facet arthropathy and degenerative      joint disease, L4-L5, L5-S1.  4. Spastic paraparesis.  5. Anxiety/depression.  6. Erectile dysfunction.   PLAN:  1. We will try him on Darvocet-N 100 for his baseline pain control,      one q.8 h. p.r.n.  We considered using scheduled to medication, but      he preferred not having to come back for refills on regular basis.      I told him that going to something like Percocet would need more      rigid monitoring of his medications.  2. Encouraged regular exercise and range of motion.  I advised to stay      away from weights and resistance, exercises for the back and      stomach.  Pilates, exercises would be great.  I asked him to check      into his benefits because I would like to see him return to PT for      refresh the course of therapy.  3. Continue Neurontin as well as naproxen, stomach permitting.  4. I will see him  back in about 2 months' time.      William Bryan, M.D.  Electronically Signed     ZTS/MedQ  D:  02/02/2008 10:11:16  T:  02/03/2008 06:33:11  Job #:  [id number]   cc:   William Bryan, M.D.  Fax: [phone]

## 2010-12-24 NOTE — Assessment & Plan Note (Signed)
HISTORY OF PRESENT ILLNESS:  William Bryan is back regarding his back pain.  In  general, he is doing a bit better.  He is liking the oxycodone.  He does  have some occasional nausea, but he seems to be coping with that.  He  feels that some changes in his diet are helping as well.  The oxycodone  allows him a little more freedom with his activity.  Mood has been good.  He remains on Neurontin for his neuropathic pain.  He uses Viagra for  erectile dysfunction.  The patient states his pain ranges from a 5 to 8  out of 10 and remains mostly axial and in the distal lower extremities.  Sleep is fair.  Mood is good.   REVIEW OF SYSTEMS:  Notable for bladder control problems, tremor, weight  gain, easy bleeding, or limb swelling.  Full review is in the written  health and history section.   SOCIAL HISTORY:  The patient is divorced and lives alone.   PHYSICAL EXAMINATION:  VITAL SIGNS:  Blood pressure is 128/65, pulse is  62, and respirations 18, and he is sating 98% on room air.  GENERAL:  The patient is pleasant, alert, and oriented x3.  His affect  is bright and appropriate.  EXTREMITIES:  Gait is stable.  His strength is generally 5/5 with 3+  reflexes in both legs.  Cognitively, he is intact.  He does have some  pain with lumbar flexion more than extension today.   ASSESSMENT:  1. History of neuraxial epidural and subdural hematomas with      subsequent incomplete C6 spinal cord injury.  2. Dysesthetic spinal cord pain.  3. Lumbar facet arthropathy.  4. Anxiety/depression.  5. Erectile dysfunction.   PLAN:  1. Continue Viagra and Neurontin as written.  2. Refill oxycodone 5 mg q.8 h. p.r.n., #90.  He is generally using 1-      2 a day.  If he is having persistent nausea, we will need to      reconsider this medication.  He states that he feels it is mostly      due to his diet.  He will talk about it with Dr. Andi Devon when he      sees him.  3. I gave him Pilates exercise as previously.   He needs to work on      core muscle strength and posture, and he is to stay away from heavy      lifting exercises.  4. I will see him in about 3 months.      Ranelle Oyster, M.D.  Electronically Signed     ZTS/MedQ  D:  03/29/2008 10:01:01  T:  03/30/2008 02:30:23  Job #:  [id number]   cc:   Gabriel Earing, M.D.  Fax: [phone]

## 2010-12-27 NOTE — Consult Note (Signed)
NAME:  William Bryan, William Bryan NO.:  [id number]   MEDICAL RECORD NO.:  [id number]                   PATIENT TYPE:  INP   LOCATION:  3001                                 FACILITY:  [facility]   PHYSICIAN:  Hillery Aldo, M.D.                DATE OF BIRTH:  [birth year]   DATE OF CONSULTATION:  DATE OF DISCHARGE:                          INTERNAL MEDICINE CONSULTATION   REASON FOR CONSULTATION:  I was asked by Dr. Javier Docker of orthopedics  to see this 70 year old right-handed white male who was asked to come into  the emergency department for evaluation after an abnormal MRI on March 29, 2002.   HISTORY OF PRESENT ILLNESS:  The patient had a repeat MRI with contrast that  showed abnormal subarachnoid fluid collection consistent with either blood  or protein such as a perineoplastic process.  The patient reports that he  was in his usual state of health until approximately 2-1/2 years ago when he  injured his lower back while on the job.  On workup at that time, he was  found to have arthritic-type changes.  He has been on workers compensation  secondary to chronic back pain every since.  The patient reports a recent  onset of flash-type pain associated with radiation down his bilateral arms.  He describes this pain as being like a toothache having an electrical  shock type sensation.  The pain also is felt in the lower back with  radiation down both legs.  The patient reports that these painful attacks  last approximately one hour and have been associated with loss of power in  his bilateral legs and also with foot paresthesias that gradually creep up  to the waist area.  The patient reports that pain medications take the edge  off his pain but do not completely relieve it.  The patient cannot ambulate  during these episodes.  The patient report that the initial episode came on  suddenly and was associated with a severe headache (like whiplash) and he  has  had headaches with these attacks ever since.  He does report some two to  three-month history of blurry vision in the right eye.   Dr. Javier Docker is working the patient up for possible disc rupture.  The MRI was initially read as degenerative disc disease and this was later  revised, approximately 10 days later.  The subarachnoid/subdural blood or  protein which, as specified above, was repeated and confirmed.   PAST MEDICAL HISTORY:  1. Hypertension.  2. Chronic lower back pain.  3. Arthritis.  4. Anxiety.   ALLERGIES:  CODEINE intolerance which causes nausea and vomiting.   CURRENT MEDICATIONS:  1. Catapres 1 mg p.o. b.i.d.  2. Hydrocodone 7.5/APAP 325 mg 1-2 q.4h. p.r.n.   SOCIAL HISTORY:  The patient is single with no children.  He is on workman's  compensation for approximately  two and a half years.  Prior to this, he was  a truckdriver.  He has a remote tobacco history but quit over 20 years ago.  He reports occasional alcohol, up to three beers at a time.  On weekend, no  drugs.  He completed high school.  Up until today, he has done all of his  own activities of daily living.   FAMILY HISTORY:  Mother is deceased at age 70 secondary to osteoporosis and  complications from a fall. The patient is unaware of his father's history  and he has no siblings.   REVIEW OF SYSTEMS:  CONSTITUTIONAL:  No fever or chills.  The patient  reports a profound 45 pound weight loss over the past three weeks.  He does  have some diaphoresis with attacks but no other sweats.  CARDIOVASCULAR:  No  chest pain, arrhythmias.  Occasional lower extremity swelling.  RESPIRATORY:  No shortness of breath except with painful attacks.  No cough.  GASTROINTESTINAL:  The patient reports severe constipation since being on  pain medication.  Denies any hematochezia or melena.  GENITOURINARY:  The  patient has significant hesitancy and dysuria with burning.  Denies any  hematuria.  He reports that he  has to physically press on his bladder to  evacuate his bladder.  MUSCULOSKELETAL:  As per HPI.  NEUROLOGICAL:  As per  HPI.   PHYSICAL EXAMINATION:  VITAL SIGNS:  Blood pressure 177/107, pulse 95,  respirations 24, temperature 97.9.  GENERAL:  Well-developed, well-nourished white male with back pain.  HEENT:  Normocephalic and atraumatic.  Oropharynx is clear.  Sclerae are  nonicteric.  NECK:  Supple on passive range of motion.  No JVD.  No carotid bruits.  CHEST:  Lungs clear to auscultation bilaterally.  HEART:  Regular rate and rhythm.  No murmurs, rubs, or gallops.  ABDOMEN:  Soft, nontender, and nondistended.  Bowel sounds present x 4.  EXTREMITIES:  No clubbing, edema, or cyanosis.  Dorsalis pedis pulses are 2+  and symmetric.  NEUROLOGICAL:  The patient is alert and oriented x 3.  He has good  concentration with good fund of knowledge.  There is no appreciable  dysarthria.  Cranial nerves II through XII intact.  The patient's visual  acuity is slightly decreased on the right.  His extraocular movements  intact.  No nystagmus.  Visual fields are full.  Muscles of mastication and  facial muscles are equally strong.  Tongue is midline.  Palate rises  symmetrically.  Slight diminished gag.  Motor exam shows the patient has 5/5  strength in his biceps, triceps, deltoids, and brachial radialis.  Additionally, he has 4+/5 strength in his bilateral quadriceps, iliopsoas,  gastrocnemius, and anterior tibialis.  The patient does have fairly  significant pain with straight leg raises.  Cerebellar function is intact  with no dysmetria on finger-nose-finger and rapid alternating movements.  Sensation is intact to pinprick, soft touch, and position sense throughout  except for a mild decrease in pinprick sensation in right foot.  Reflexes  shows the patient has 1+ brachial radialis, biceps, and triceps which are symmetric.  The patient has 2+ patella and ankle jerks.  His toes are  downgoing  bilaterally.  There is no clonus appreciated.   ASSESSMENT:  The patient is a 70 year old white male with chronic lower back  and a thoracolumbar epidural fluid collection suspicious for blood versus  protein.  This may represent a perineoplastic process.  We will need to rule  out aneurysm and neoplasm.   PLAN:  1. Admit the patient to the neurology service.  2. Check MRI of the brain, C-spine.  3. Intracranial MRA.  4. Possible lumbar puncture in the morning, possibly a fluoroscopy given the     patient's severe back pain.                                               Hillery Aldo, M.D.    CR/MEDQ  D:  03/30/2002  T:  04/02/2002  Job:  [id number]

## 2010-12-27 NOTE — Discharge Summary (Signed)
NAME:  William Bryan, William Bryan NO.:  [id number]   MEDICAL RECORD NO.:  [id number]          PATIENT TYPE:  INP   LOCATION:  1405                         FACILITY:  [facility]   PHYSICIAN:  Claudette Laws, M.D.  DATE OF BIRTH:  1940-10-13   DATE OF ADMISSION:  04/10/2005  DATE OF DISCHARGE:  04/12/2005                                 DISCHARGE SUMMARY   HISTORY:  This is a 70 year old gentleman who was worked up in the office  with symptoms of BPH as well as urethral stricture disease and increased  postvoid residual.  We went over the treatment options and a decision was  made to perform a TUR.  The procedure was explained to him, he understands  and agrees to the proposed surgery.   PERTINENT LABORATORY DATA:  The pathology report revealed BPH.   LABORATORY DATA:  His electrolytes were normal with a BUN of 19 and  creatinine of 1.3.  His hemoglobin was 15.6, hematocrit 45.2, white cell  count was 3100.   HOSPITAL COURSE:  The patient came in, was an a.m. admission, on April 10, 2005.  He was found to have a urethral stricture which we dilated with  sounds.  He then underwent a TUR of his prostate as well as a transurethral  incision of the prostate gland without incident.  Postop, he did well and  had an uneventful, afebrile postop course.  We removed the catheter on the  second postop day and he went home voiding well, afebrile.   FINAL DIAGNOSES:  1.  Benign prostatic hypertrophy with bladder outlet obstruction.  2.  Urethral stricture disease.   OPERATION:  Cystoscopy, dilation of urethral stricture, transurethral  resection of prostate gland, and transurethral incision of the prostate.   COMPLICATIONS:  None.   CONDITION ON DISCHARGE:  Recovering.   DISCHARGE MEDICATIONS:  Cipro 250 mg one twice daily, #10.   DISPOSITION:  Regular diet, force fluids, limited activity, to see me in the  office in 2 weeks for follow-up.      Claudette Laws, M.D.  Electronically Signed     RFS/MEDQ  D:  06/03/2005  T:  06/04/2005  Job:  [id number]

## 2010-12-27 NOTE — Op Note (Signed)
NAME:  William Bryan, William Bryan NO.:  [id number]   MEDICAL RECORD NO.:  [id number]          PATIENT TYPE:  INP   LOCATION:  0003                         FACILITY:  [facility]   PHYSICIAN:  Claudette Laws, M.D.  DATE OF BIRTH:  [birth year]   DATE OF PROCEDURE:  04/10/2005  DATE OF DISCHARGE:                                 OPERATIVE REPORT   PREOPERATIVE DIAGNOSES:  1.  Benign prostatic hypertrophy.  2.  Recurrent urinary tract infection.  3.  Urethral stricture.   POSTOPERATIVE DIAGNOSES:  1.  Benign prostatic hypertrophy.  2.  Recurrent urinary tract infection.  3.  Urethral stricture.   PROCEDURE:  1.  Cystourethroscopy.  2.  Dilation of urethral stricture.  3.  Transurethral resection of prostate.  4.  Transurethral incision of prostate.   SURGEON:  Claudette Laws, M.D.   ASSISTANT:  Glade Nurse, M.D.   ANESTHESIA:  General endotracheal.   SPECIMEN:  A prostate chips.   DESCRIPTION OF PROCEDURE:  The patient was identified by his wrist bracelet  and brought to room 10 where he received preoperative antibiotics.  He was  prepped and draped in usual sterile fashion.  Because of his history of  previous history of urethral stricture, he underwent was cystoscopic  evaluation prior to this TURP.  A 22-French reaction was stopped sheath with  a 12 degree lens was inserted into the anterior urethra.  His anterior  urethra to the point of the bulbar urethra was without abnormality.  At the  level of the bulb, there  There was approximately 10-French circumferential  urethral stricture.  It was quite short.  I was not  able to pass the scope  through the stricture so we placed a 0.038 guidewire through the scope  through the urethral stricture and bladder. The scope was back loaded across  the wire.  Next, using __________ sounds, the stricture was dilated  progressively from 10 [place] until 30-French without difficulty.  Next, we  removed the __________ sound and  a cystoscope was inserted along the wire.  The  stricture dilated quite nicely to approximately a 28-French  circumferentially.  The remainder of the bulbar urethra and urethral were  without abnormality.  Upon entering his prostatic urethra, it was noted was  quite short in length.  Its lateral lobes were somewhat small in nature.  However, he had a prominent bladder neck.  We then entered his bladder which  was markedly trabeculated with cellules throughout.  His ureteral orifices  were noted in their normal anatomic position effluxing clear urine  bilaterally.  The remainder of his mucosa with the exception of the  aforementioned trabeculation with cellules was without mucosal abnormality  or foreign body.  There were no foreign bodies or  abnormalities within any  of the cellules.  Next, we removed the cystoscope and placed a 28-French  resectoscope sheath access the patient the guidewire to the level of the  bladder.  Excellent urine output was obtained.  The resectoscope was placed  through the sheath and the guidewire was removed.  Next, we  marked distal to  the ureteral orifice to mark their position.  Next, we created a channel at  the 6 o'clock position between the bladder neck and the verumontanum to  facilitate a flow of fluid and prostatic chips.  Once this was done, the  bladder neck opened up point nicely.  We proceeded to resect from the 6  o'clock position to the 1 o'clock position and the 11 o'clock position,  respectively.  This was done from the bladder neck to verumontanum.  Once we  had obtained an adequate depth, we ensured we had excellent hemostasis.  We  then evacuated the chips with a Toomey syringe.  Next, we then reinspected  the prostatic fossa.  We were quite happy with our depth of resection  circumferentially.  However, we felt the bladder neck could be opened and  the aperture of the bladder neck could be improved upon.  Because of the  response creating  the channel, it was elected to place a Collings knife in  the resectoscope and further incise the bladder neck at the 6 o'clock  position from the level of the bladder neck to the verumontanum.  Once doing  this, the prostatic fossa further opened quite nicely.  We then reinspected  for foreign bodies in the bladder, which we saw none of.  The prostatic  fossa was reinspected and found to be hemostatic. We removed the  resectoscope and a 24-French, three-way catheter was placed using the  catheter sound.  Excellent urine output was obtained.  We connected the  Foley catheter to traction as well as to drainage bag and to normal saline  continuous bladder irrigation.  The patient was reversed from his anesthesia  which he tolerated without complication.  Please note Dr. Mickel Crow was  present and participated in all aspects of this case.     ______________________________  Glade Nurse, MD      Claudette Laws, M.D.  Electronically Signed    MT/MEDQ  D:  04/10/2005  T:  04/10/2005  Job:  [id number]

## 2011-04-10 ENCOUNTER — Emergency Department (HOSPITAL_COMMUNITY)
Admission: EM | Admit: 2011-04-10 | Discharge: 2011-04-10 | Disposition: A | Discharge status: Home / Self Care | Payer: No Typology Code available for payment source | Attending: Emergency Medicine | Admitting: Emergency Medicine

## 2011-04-10 DIAGNOSIS — Z79899 Other long term (current) drug therapy: Secondary | ICD-10-CM | POA: Insufficient documentation

## 2011-04-10 DIAGNOSIS — F29 Unspecified psychosis not due to a substance or known physiological condition: Secondary | ICD-10-CM | POA: Insufficient documentation

## 2011-04-10 DIAGNOSIS — R32 Unspecified urinary incontinence: Secondary | ICD-10-CM | POA: Insufficient documentation

## 2011-04-10 DIAGNOSIS — Z8673 Personal history of transient ischemic attack (TIA), and cerebral infarction without residual deficits: Secondary | ICD-10-CM | POA: Insufficient documentation

## 2011-04-10 DIAGNOSIS — G40909 Epilepsy, unspecified, not intractable, without status epilepticus: Secondary | ICD-10-CM | POA: Insufficient documentation

## 2011-04-10 DIAGNOSIS — I10 Essential (primary) hypertension: Secondary | ICD-10-CM | POA: Insufficient documentation

## 2011-04-10 DIAGNOSIS — R259 Unspecified abnormal involuntary movements: Secondary | ICD-10-CM | POA: Insufficient documentation

## 2011-04-10 LAB — URINALYSIS, ROUTINE W REFLEX MICROSCOPIC
Bilirubin Urine: NEGATIVE
Glucose, UA: NEGATIVE mg/dL
Ketones, ur: NEGATIVE mg/dL
pH: 5.5 (ref 5.0–8.0)

## 2011-04-10 LAB — CBC
Hemoglobin: 14.9 g/dL (ref 13.0–17.0)
MCHC: 34.3 g/dL (ref 30.0–36.0)
RBC: 4.78 MIL/uL (ref 4.22–5.81)
WBC: 6 10*3/uL (ref 4.0–10.5)

## 2011-04-10 LAB — DIFFERENTIAL
Basophils Absolute: 0 10*3/uL (ref 0.0–0.1)
Basophils Relative: 1 % (ref 0–1)
Monocytes Absolute: 0.4 10*3/uL (ref 0.1–1.0)
Neutro Abs: 4.7 10*3/uL (ref 1.7–7.7)
Neutrophils Relative %: 78 % — ABNORMAL HIGH (ref 43–77)

## 2011-04-10 LAB — COMPREHENSIVE METABOLIC PANEL
ALT: 69 U/L — ABNORMAL HIGH (ref 0–53)
Alkaline Phosphatase: 62 U/L (ref 39–117)
GFR calc Af Amer: >60 mL/min (ref >60)
Glucose, Bld: 142 mg/dL — ABNORMAL HIGH (ref 70–99)
Potassium: 3.9 mEq/L (ref 3.5–5.1)
Sodium: 147 mEq/L — ABNORMAL HIGH (ref 135–145)
Total Protein: 6.5 g/dL (ref 6.0–8.3)

## 2015-12-26 ENCOUNTER — Other Ambulatory Visit (HOSPITAL_COMMUNITY): Payer: Self-pay | Admitting: Neurosurgery

## 2015-12-26 DIAGNOSIS — R531 Weakness: Secondary | ICD-10-CM

## 2016-01-11 ENCOUNTER — Ambulatory Visit (HOSPITAL_COMMUNITY)
Admission: RE | Admit: 2016-01-11 | Discharge: 2016-01-11 | Disposition: A | Discharge status: Home / Self Care | Payer: Medicare Other | Source: Ambulatory Visit | Attending: Neurosurgery | Admitting: Neurosurgery

## 2016-01-11 DIAGNOSIS — R531 Weakness: Secondary | ICD-10-CM | POA: Diagnosis not present

## 2016-01-11 DIAGNOSIS — G950 Syringomyelia and syringobulbia: Secondary | ICD-10-CM | POA: Diagnosis not present

## 2016-02-01 ENCOUNTER — Ambulatory Visit (INDEPENDENT_AMBULATORY_CARE_PROVIDER_SITE_OTHER): Payer: Medicare Other

## 2016-02-01 ENCOUNTER — Ambulatory Visit (INDEPENDENT_AMBULATORY_CARE_PROVIDER_SITE_OTHER): Payer: Medicare Other | Admitting: Podiatry

## 2016-02-01 ENCOUNTER — Encounter: Payer: Self-pay | Admitting: Podiatry

## 2016-02-01 VITALS — BP 122/82 | HR 92 | Resp 12

## 2016-02-01 DIAGNOSIS — M2021 Hallux rigidus, right foot: Secondary | ICD-10-CM

## 2016-02-01 DIAGNOSIS — M79671 Pain in right foot: Secondary | ICD-10-CM | POA: Diagnosis not present

## 2016-02-01 DIAGNOSIS — M79672 Pain in left foot: Secondary | ICD-10-CM

## 2016-02-01 DIAGNOSIS — M2022 Hallux rigidus, left foot: Secondary | ICD-10-CM

## 2016-02-01 DIAGNOSIS — M898X9 Other specified disorders of bone, unspecified site: Secondary | ICD-10-CM

## 2016-02-01 NOTE — Progress Notes (Signed)
   Subjective:    Patient ID: Gwynneth AlbrightJames A Jons, male    DOB: 28-Feb-1941, 75 y.o.   MRN: [id number]  HPI 75 year old male presents the office they for gross on the top of both of his big toes which is been ongoing that he has noticed for about 10 months. He does has pain to this area when he tries to the pressure. He said no recent treatment for this. Denies any recent injury or trauma to his feet. No swelling. No other complaints at this time.  Review of Systems  Musculoskeletal: Positive for back pain.  Skin: Positive for color change.       Objective:   Physical Exam General: AAO x3, NAD  Dermatological: Skin is warm, dry and supple bilateral. Nails x 10 are well manicured; remaining integument appears unremarkable at this time. There are no open sores, no rash or signs of infection present.  Vascular: Dorsalis Pedis artery and Posterior Tibial artery pedal pulses are 2/4 bilateral with immedate capillary fill time. Pedal hair growth present. There is no pain with calf compression, swelling, warmth, erythema.   Neruologic: Grossly intact via light touch bilateral. Vibratory intact via tuning fork bilateral. Protective threshold with Semmes Wienstein monofilament intact to all pedal sites bilateral.   Musculoskeletal: There is no motion of both of his bilateral first MTPJ's and there is large dorsal spurring present off the dorsal aspect of the first MPJs. There is slight erythema overlying this area from where it was irritated in shoes. There is no skin breakdown however. There is tenderness along the bone spurs area no other areas of tenderness bilaterally.      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 Severely arthritic first MTPJ's bilaterally. Dorsal spurring is present. -I  discussed both conservative and surgical treatment options. At this time he has had no treatment and we will start with conservative treatment. I dispensed offloading pads also discussed shoe gear modifications. Ultimately he may need surgical intervention. Discussed the first MTPJ arthrodesis. -Follow-up as scheduled or sooner if needed.  Ovid CurdMatthew Wagoner, DPM

## 2016-02-03 DIAGNOSIS — M2021 Hallux rigidus, right foot: Secondary | ICD-10-CM | POA: Insufficient documentation

## 2016-02-03 DIAGNOSIS — M2022 Hallux rigidus, left foot: Secondary | ICD-10-CM

## 2016-07-28 ENCOUNTER — Encounter: Payer: Self-pay | Admitting: Podiatry

## 2016-07-28 ENCOUNTER — Ambulatory Visit: Payer: Medicare Other

## 2016-07-28 ENCOUNTER — Ambulatory Visit (INDEPENDENT_AMBULATORY_CARE_PROVIDER_SITE_OTHER): Payer: Medicare Other | Admitting: Podiatry

## 2016-07-28 DIAGNOSIS — S91302A Unspecified open wound, left foot, initial encounter: Secondary | ICD-10-CM

## 2016-07-28 DIAGNOSIS — L97521 Non-pressure chronic ulcer of other part of left foot limited to breakdown of skin: Secondary | ICD-10-CM | POA: Diagnosis not present

## 2016-07-28 DIAGNOSIS — B351 Tinea unguium: Secondary | ICD-10-CM

## 2016-07-28 DIAGNOSIS — L600 Ingrowing nail: Secondary | ICD-10-CM

## 2016-07-28 DIAGNOSIS — M2042 Other hammer toe(s) (acquired), left foot: Secondary | ICD-10-CM | POA: Diagnosis not present

## 2016-07-28 DIAGNOSIS — M2041 Other hammer toe(s) (acquired), right foot: Secondary | ICD-10-CM

## 2016-07-28 DIAGNOSIS — M79675 Pain in left toe(s): Secondary | ICD-10-CM

## 2016-07-28 DIAGNOSIS — M79674 Pain in right toe(s): Secondary | ICD-10-CM

## 2016-07-28 MED ORDER — CEPHALEXIN 500 MG PO CAPS: 500.0000 mg | ORAL_CAPSULE | Freq: Three times a day (TID) | ORAL | 2 refills | Status: AC

## 2016-07-28 MED ORDER — SILVER SULFADIAZINE 1 % EX CREA: 1.0000 "application " | TOPICAL_CREAM | Freq: Every day | CUTANEOUS | 0 refills | Status: AC

## 2016-07-28 NOTE — Patient Instructions (Signed)
Apply silvadene to the left foot ulcer daily. Start keflex Follow-up in 3 weeks or sooner if needed. Monitor for any signs/symptoms of infection. Call the office immediately if any occur or go directly to the emergency room. Call with any questions/concerns.

## 2016-07-28 NOTE — Progress Notes (Signed)
Subjective: 75 year old male presents the also concerns of wound the ulcer aspect the left which is ongoing for a couple weeks that he has noticed. He is unsure of how the area started. Denies he swelling or redness to his foot. He also states his ingrown toenails the right big toe. The cushions I dispensed for the bone spurs have helped with the left side more than right. Denies any systemic complaints such as fevers, chills, nausea, vomiting. No acute changes since last appointment, and no other complaints at this time.   Objective: AAO x3, NAD; presents in a wheelchair with a caregiver. DP/PT pulses palpable bilaterally, CRT less than 3 seconds Significant dorsal spurring present bilateral first MTPJ's with no range of motion of the MPJs. There is no amount edema, increase in warmth these areas.  Superficial granular wound present to the lateral aspect left foot on the fifth metatarsal head laterally. Small hyperkeratotic periwound. Also there is some dried blood on the skin was appears be likely from a previous blister all there is no fluid in the area today. After debridement the wound measures 1 x 1 cm there's superficial granular wound base. There is no probing, undermining or tunneling. Small amount of serosanguineous drainage expressed. No malodor. Nails are hypertrophic, dystrophic, brittle, discolored, elongated 10. There is no surrounding redness or drainage from the nail sites. Tenderness nails 1-5 bilaterally protective the right hallux toenail with mild induration distally. No open lesions or pre-ulcerative lesions.  Hammertoes present.  No pain with calf compression, swelling, warmth, erythema  Assessment: Left foot ulceration, symptomatic onychomycosis/right hallux ingrown toenail.   Plan: -All treatment options discussed with the patient including all alternatives, risks, complications.  -I attempted to get x-rays however due to him being in a wheelchair unable to get the x-ray  office today. -Wound was debrided to granular tissue. Continue Silvadene dressing changes daily and this is prescribed. Also prescribed Keflex. Monitoring signs or symptoms of infection to the ER should any occur call the office. -Nails debrided 10 without complications or bleeding. -Patient encouraged to call the office with any questions, concerns, change in symptoms.   Ovid CurdMatthew Brianca Fortenberry, DPM

## 2016-08-18 ENCOUNTER — Ambulatory Visit (INDEPENDENT_AMBULATORY_CARE_PROVIDER_SITE_OTHER): Payer: Medicare Other | Admitting: Podiatry

## 2016-08-18 ENCOUNTER — Encounter: Payer: Self-pay | Admitting: Podiatry

## 2016-08-18 DIAGNOSIS — S91302A Unspecified open wound, left foot, initial encounter: Secondary | ICD-10-CM | POA: Diagnosis not present

## 2016-08-18 NOTE — Progress Notes (Signed)
   Subjective:    Patient ID: William AlbrightJames A Bryan, male    DOB: 1941-07-29, 76 y.o.   MRN: [id number]  HPI 76 year old male presents the office today with his caregiver for follow-up evaluation of the wound to the left foot. He states he is unsure if a complete the course of antibiotics. He is having a dressing change daily. He is an occasional pain of the foot as well. He denies any systemic complaints as fevers, chills, nausea, vomiting. Denies any calf pain, chest pain, shortness of breath. He has no new concerns today.   Review of Systems  All other systems reviewed and are negative.      Objective:   Physical Exam General: AAO x3, NAD  Dermatological: On the lateral aspect of the left foot along the fifth metatarsal head is a superficial granular wound measuring approximately 0.5 x 0.5 cm. There is no probing, undermining or tunneling. There is no swelling erythema, ascending cellulitis, fluctuance, crepitus, malodor. There is no other open lesions or pre-ulcer lesions identified today.  Vascular: Dorsalis Pedis artery and Posterior Tibial artery pedal pulses are 2/4 bilateral with immedate capillary fill time.  There is no pain with calf compression, swelling, warmth, erythema.   Neruologic: Grossly intact via light touch bilateral. Vibratory intact via tuning fork bilateral. Protective threshold with Semmes Wienstein monofilament intact to all pedal sites bilateral.  Musculoskeletal: Decreased MMT. Presents in a wheelchair.     Assessment: 76 year old male left lateral foot ulceration, healing.  Plan: -Treatment options discussed including all alternatives, risks, and complications -Etiology of symptoms were discussed -For the patient's record his advice having completed. No signs of infections we will hold off on further and a maxillary. Recommend Silvadene dressing changes daily. Offloading pads were again dispensed today. Monitor for any signs or symptoms of worsening infection  or recurrence of the ER means any occur. Otherwise also next 2-3 weeks or sooner if needed. Call any question or concerns in the meantime.  William CurdMatthew Ahmar Bryan, DPM

## 2016-09-01 ENCOUNTER — Ambulatory Visit: Payer: Medicare Other | Admitting: Podiatry

## 2016-10-31 ENCOUNTER — Other Ambulatory Visit (HOSPITAL_COMMUNITY): Payer: Self-pay | Admitting: Internal Medicine

## 2016-10-31 DIAGNOSIS — M869 Osteomyelitis, unspecified: Secondary | ICD-10-CM

## 2016-11-04 ENCOUNTER — Ambulatory Visit (HOSPITAL_COMMUNITY)
Admission: RE | Admit: 2016-11-04 | Discharge: 2016-11-04 | Disposition: A | Discharge status: Home / Self Care | Payer: Medicare Other | Source: Ambulatory Visit | Attending: Internal Medicine | Admitting: Internal Medicine

## 2016-11-04 DIAGNOSIS — L02612 Cutaneous abscess of left foot: Secondary | ICD-10-CM | POA: Diagnosis not present

## 2016-11-04 DIAGNOSIS — L97529 Non-pressure chronic ulcer of other part of left foot with unspecified severity: Secondary | ICD-10-CM | POA: Diagnosis not present

## 2016-11-04 DIAGNOSIS — M869 Osteomyelitis, unspecified: Secondary | ICD-10-CM | POA: Diagnosis present

## 2018-01-09 DEATH — deceased
